# Patient Record
Sex: Female | Born: 1985 | Race: White | Hispanic: No | Marital: Single | Smoking: Never smoker
Health system: Southern US, Community
[De-identification: ages and names within clinical notes are randomized; demographics above are authoritative.]

---

## 2010-10-04 ENCOUNTER — Encounter: Payer: Self-pay | Admitting: Obstetrics and Gynecology

## 2013-08-17 ENCOUNTER — Encounter (HOSPITAL_BASED_OUTPATIENT_CLINIC_OR_DEPARTMENT_OTHER): Payer: Self-pay | Admitting: Emergency Medicine

## 2013-08-17 ENCOUNTER — Emergency Department (HOSPITAL_BASED_OUTPATIENT_CLINIC_OR_DEPARTMENT_OTHER): Payer: Medicaid Other

## 2013-08-17 ENCOUNTER — Emergency Department (HOSPITAL_BASED_OUTPATIENT_CLINIC_OR_DEPARTMENT_OTHER)
Admission: EM | Admit: 2013-08-17 | Discharge: 2013-08-17 | Disposition: A | Discharge status: Home / Self Care | Payer: Medicaid Other | Attending: Emergency Medicine | Admitting: Emergency Medicine

## 2013-08-17 DIAGNOSIS — M25519 Pain in unspecified shoulder: Secondary | ICD-10-CM | POA: Insufficient documentation

## 2013-08-17 DIAGNOSIS — E663 Overweight: Secondary | ICD-10-CM | POA: Insufficient documentation

## 2013-08-17 DIAGNOSIS — M898X1 Other specified disorders of bone, shoulder: Secondary | ICD-10-CM

## 2013-08-17 MED ORDER — IBUPROFEN 600 MG PO TABS: 600.0000 mg | ORAL_TABLET | Freq: Four times a day (QID) | ORAL | Status: AC | PRN

## 2013-08-17 MED ORDER — IBUPROFEN 800 MG PO TABS
800.0000 mg | ORAL_TABLET | Freq: Once | ORAL | Status: AC
  Administered 2013-08-17: 800 mg via ORAL
  Filled 2013-08-17: qty 1

## 2013-08-17 MED ORDER — HYDROCODONE-ACETAMINOPHEN 5-325 MG PO TABS: 1.0000 | ORAL_TABLET | Freq: Four times a day (QID) | ORAL | Status: AC | PRN

## 2013-08-17 NOTE — Discharge Instructions (Signed)
Musculoskeletal Pain °Musculoskeletal pain is muscle and boney aches and pains. These pains can occur in any part of the body. Your caregiver may treat you without knowing the cause of the pain. They may treat you if blood or urine tests, X-rays, and other tests were normal.  °CAUSES °There is often not a definite cause or reason for these pains. These pains may be caused by a type of germ (virus). The discomfort may also come from overuse. Overuse includes working out too hard when your body is not fit. Boney aches also come from weather changes. Bone is sensitive to atmospheric pressure changes. °HOME CARE INSTRUCTIONS  °· Ask when your test results will be ready. Make sure you get your test results. °· Only take over-the-counter or prescription medicines for pain, discomfort, or fever as directed by your caregiver. If you were given medications for your condition, do not drive, operate machinery or power tools, or sign legal documents for 24 hours. Do not drink alcohol. Do not take sleeping pills or other medications that may interfere with treatment. °· Continue all activities unless the activities cause more pain. When the pain lessens, slowly resume normal activities. Gradually increase the intensity and duration of the activities or exercise. °· During periods of severe pain, bed rest may be helpful. Lay or sit in any position that is comfortable. °· Putting ice on the injured area. °· Put ice in a bag. °· Place a towel between your skin and the bag. °· Leave the ice on for 15 to 20 minutes, 3 to 4 times a day. °· Follow up with your caregiver for continued problems and no reason can be found for the pain. If the pain becomes worse or does not go away, it may be necessary to repeat tests or do additional testing. Your caregiver may need to look further for a possible cause. °SEEK IMMEDIATE MEDICAL CARE IF: °· You have pain that is getting worse and is not relieved by medications. °· You develop chest pain  that is associated with shortness or breath, sweating, feeling sick to your stomach (nauseous), or throw up (vomit). °· Your pain becomes localized to the abdomen. °· You develop any new symptoms that seem different or that concern you. °MAKE SURE YOU:  °· Understand these instructions. °· Will watch your condition. °· Will get help right away if you are not doing well or get worse. °Document Released: 04/16/2005 Document Revised: 07/09/2011 Document Reviewed: 12/19/2012 °[employer]® Patient Information ©2014 [employer]. ° °

## 2013-08-17 NOTE — ED Provider Notes (Signed)
CSN: [id number]     Arrival date & time 08/17/13  1720 History  This chart was scribed for Shon Batonourtney F Horton, MD by Smiley HousemanFallon Davis, ED Scribe. The patient was seen in room MH11/MH11. Patient's care was started at 5:42 PM.    Chief Complaint  Patient presents with  . Back Pain   The history is provided by the patient. No language interpreter was used.   HPI Comments: Ruth Hobbs is a 28 y.o. female who presents to the Emergency Department complaining of constant upper right back pain that she noticed this morning.  Pt states the pain is worse with movement and deep breathing.  She currently rates her pain as a 6 out of 10.  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  Pt denies taking anything for pain PTA.  She denies fevers, chills, sore throat, cough, nausea, emesis, and abdominal pain.  Pt denies sleeping on her right side.  She denies any similar complaint.  She states she is currently taking birth control and denies the possibility of being pregnant.    History reviewed. No pertinent past medical history. Past Surgical History  Procedure Laterality Date  . Cesarean section     History reviewed. No pertinent family history. History  Substance Use Topics  . Smoking status: Never Smoker   . Smokeless tobacco: Not on file  . Alcohol Use: No   OB History   Grav Para Term Preterm Abortions TAB SAB Ect Mult Living                 Review of Systems  Respiratory: Negative for chest tightness and shortness of breath.   Cardiovascular: Negative for chest pain and leg swelling.  Gastrointestinal: Negative for abdominal pain.  Musculoskeletal:       Right shoulder pain  All other systems reviewed and are negative.     Allergies  Review of patient's allergies indicates no known allergies.  Home Medications   Prior to Admission medications   Not on File   Triage Vitals: BP 138/89  Pulse 100  Temp(Src) 98.9 F (37.2 C)  Resp 16   Ht 5\' 4"  (1.626 m)  Wt 228 lb (103.42 kg)  BMI 39.12 kg/m2  SpO2 99%  Physical Exam  Nursing note and vitals reviewed. Constitutional: She is oriented to person, place, and time. She appears well-developed and well-nourished.  overweight  HENT:  Head: Normocephalic and atraumatic.  Cardiovascular: Normal rate, regular rhythm and normal heart sounds.   Pulmonary/Chest: Effort normal and breath sounds normal. No respiratory distress. She has no wheezes.  Abdominal: Soft. There is no tenderness.  Musculoskeletal: Normal range of motion.  NOrmal ROM of the right shoulder, pain to the shoulder blade with ROM, TTP over the medial right shoulder blade  Neurological: She is alert and oriented to person, place, and time.  Skin: Skin is warm and dry. No rash noted.  Psychiatric: She has a normal mood and affect.    ED Course  Procedures (including critical care time) DIAGNOSTIC STUDIES: Oxygen Saturation is 99% on RA, normal by my interpretation.    COORDINATION OF CARE: 5:48 PM-Will order chest x-ray and motrin.  Patient informed of current plan of treatment and evaluation and agrees with plan.    Imaging Review Dg Chest 2 View  08/17/2013   CLINICAL DATA:  BACK PAIN  EXAM: CHEST  2 VIEW  COMPARISON:  None.  FINDINGS: Normal mediastinum and cardiac silhouette.  Normal pulmonary vasculature. No evidence of effusion, infiltrate, or pneumothorax. No acute bony abnormality.  IMPRESSION: No acute cardiopulmonary process.   Electronically Signed   By: Genevive BiStewart  Edmunds M.D.   On: 08/17/2013 18:16   MDM   Final diagnoses:  Shoulder blade pain   Patient presents with pain to the right shoulder blade.  Nontoxic on exam.  Hx/PE suggestive of MSK etiology.  Pain is reproducible.  CXR neg.  Patient given pain medication.  Encouraged to use NSAIDs and rest. I personally performed the services described in this documentation, which was scribed in my presence. The recorded information has been  reviewed and is accurate.      Shon Batonourtney F Horton, MD 08/19/13 2114

## 2013-08-17 NOTE — ED Notes (Signed)
Pt c/o upper right back pain x 12 hrs

## 2015-05-21 IMAGING — CR DG CHEST 2V
2 series · 2 of 2 positions shown · non-contrast
Comparison: None.

CLINICAL DATA: BACK PAIN

EXAM:
CHEST  2 VIEW

[w chest pa]
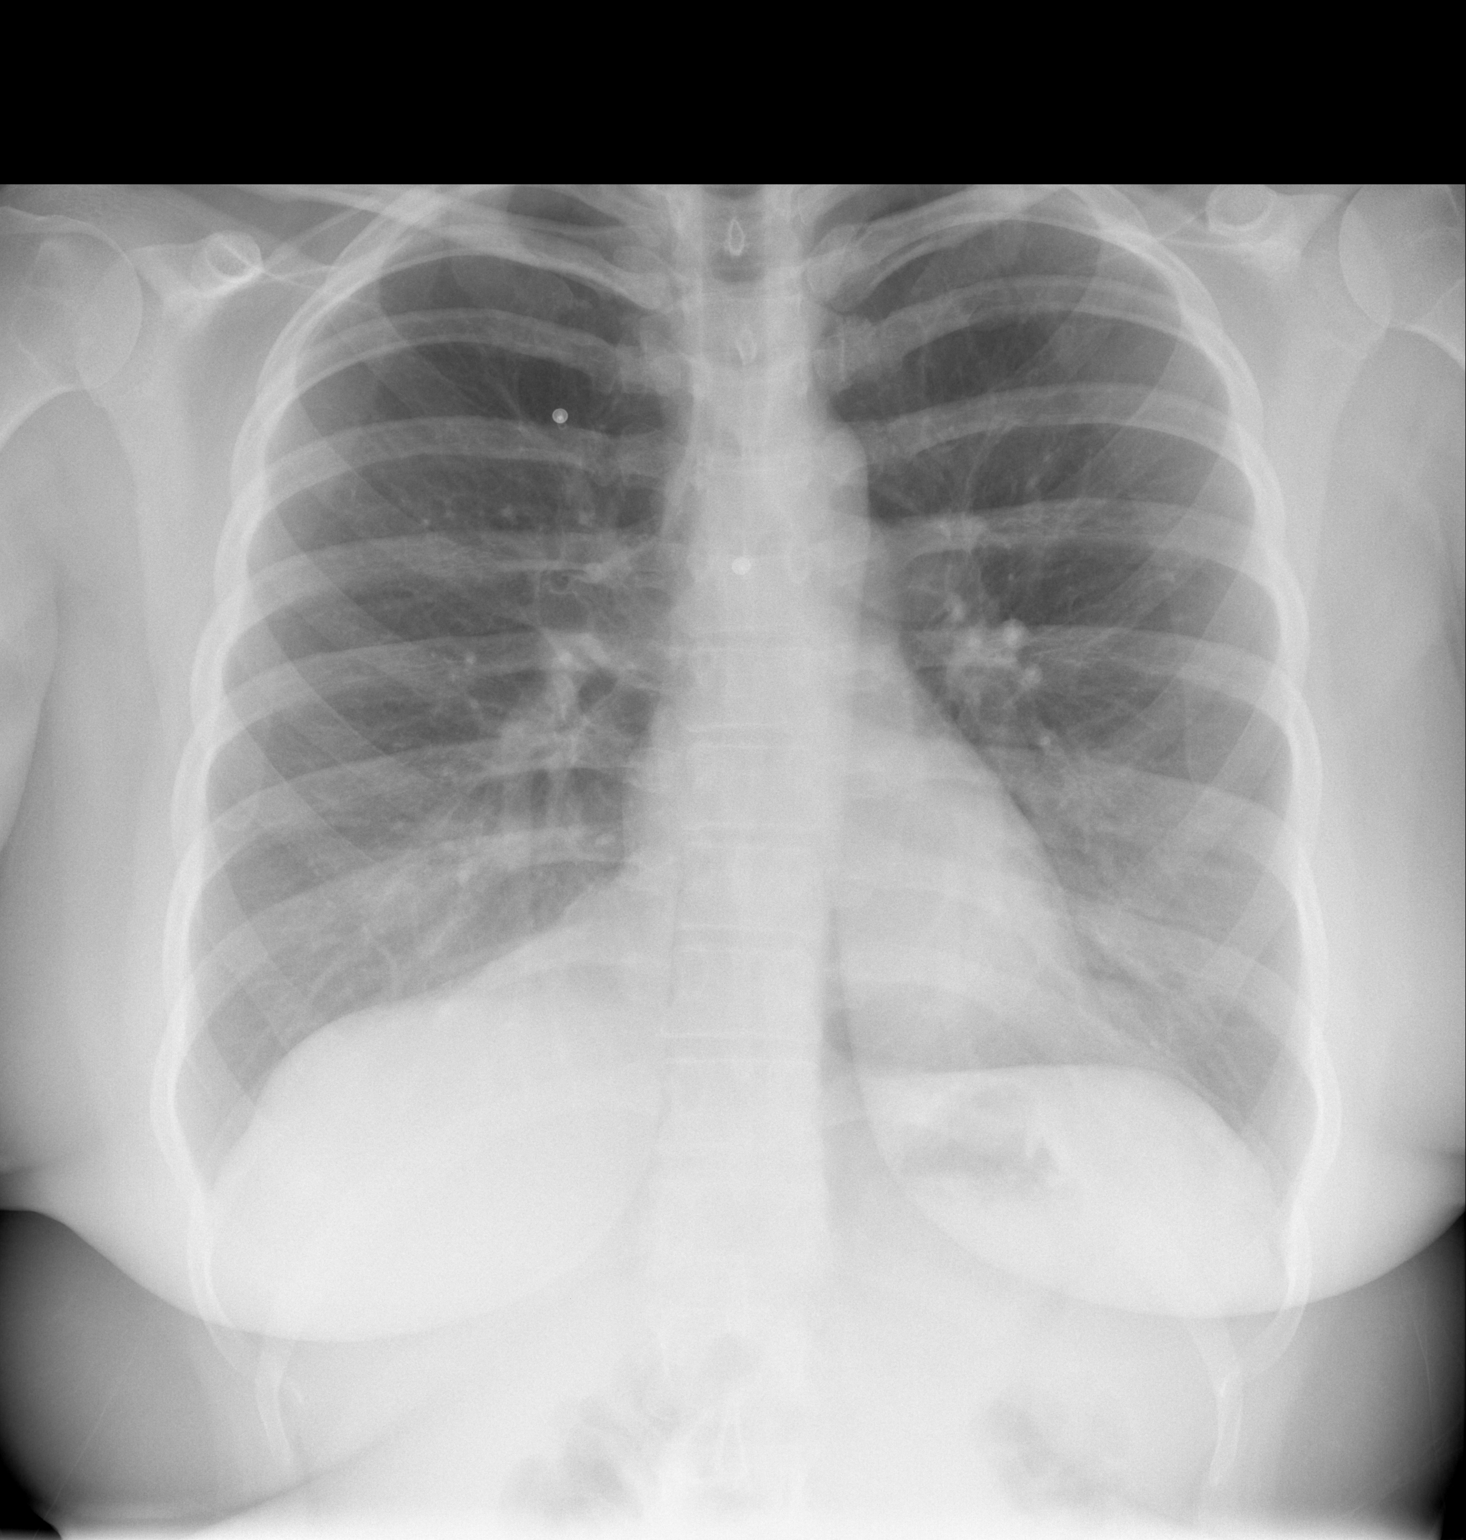

[w chest lat]
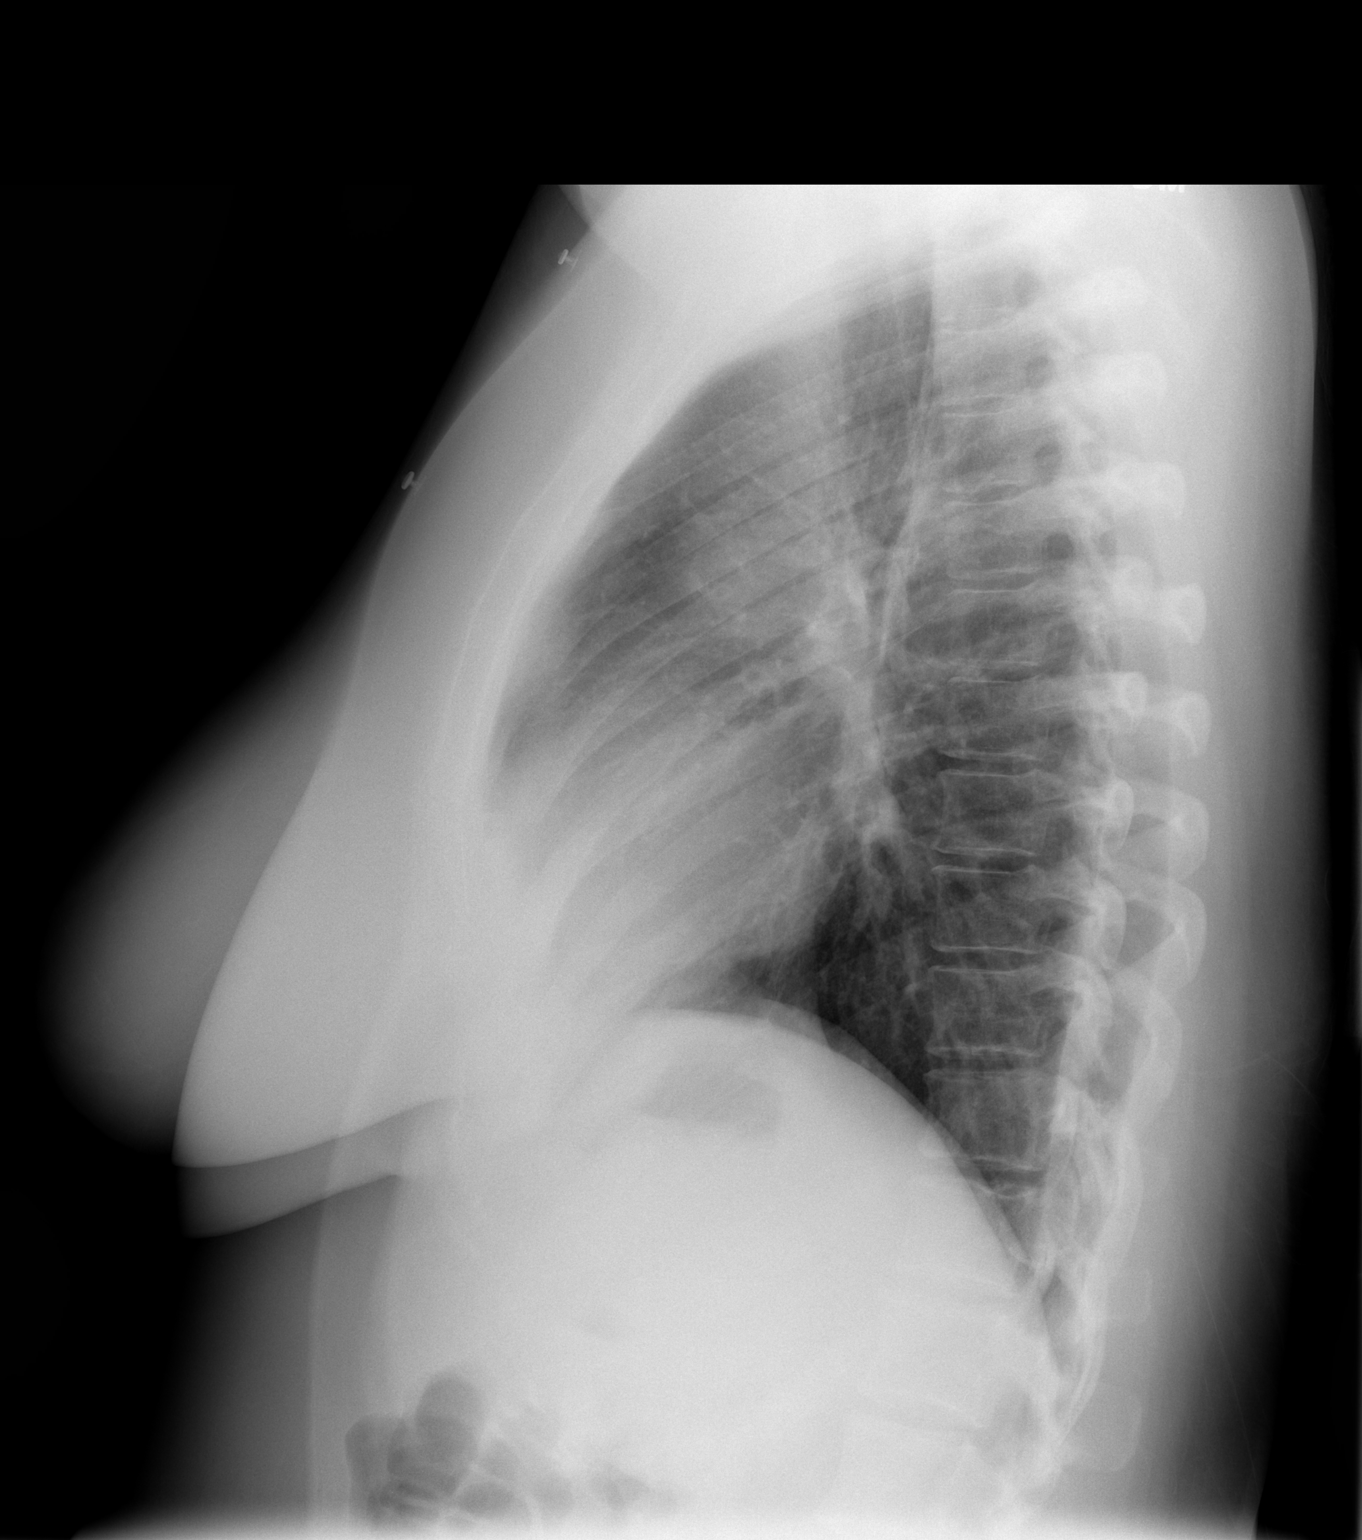

[2 of 2 positions shown; findings below may reference images not displayed]

FINDINGS: Normal mediastinum and cardiac silhouette. Normal pulmonary
vasculature. No evidence of effusion, infiltrate, or pneumothorax.
No acute bony abnormality.
IMPRESSION: No acute cardiopulmonary process.
